# Patient Record
Sex: Male | Born: 1955 | Race: White | Hispanic: No | Marital: Married | Smoking: Current every day smoker
Health system: Southern US, Community
[De-identification: ages and names within clinical notes are randomized; demographics above are authoritative.]

## PROBLEM LIST (undated history)

## (undated) DIAGNOSIS — M1990 Unspecified osteoarthritis, unspecified site: Secondary | ICD-10-CM

## (undated) DIAGNOSIS — Z9989 Dependence on other enabling machines and devices: Secondary | ICD-10-CM

## (undated) DIAGNOSIS — G4730 Sleep apnea, unspecified: Secondary | ICD-10-CM

## (undated) HISTORY — DX: Sleep apnea, unspecified: G47.30

## (undated) HISTORY — PX: LITHOTRIPSY: SUR834

## (undated) HISTORY — PX: HERNIA REPAIR: SHX51

## (undated) HISTORY — PX: NO PAST SURGERIES: SHX2092

## (undated) HISTORY — PX: TONSILLECTOMY AND ADENOIDECTOMY: SHX28

---

## 2011-06-15 ENCOUNTER — Encounter: Payer: Self-pay | Admitting: *Deleted

## 2011-06-15 ENCOUNTER — Emergency Department
Admission: EM | Admit: 2011-06-15 | Discharge: 2011-06-15 | Disposition: A | Discharge status: Home / Self Care | Payer: 59 | Source: Home / Self Care | Attending: Family Medicine | Admitting: Family Medicine

## 2011-06-15 ENCOUNTER — Emergency Department
Admit: 2011-06-15 | Discharge: 2011-06-15 | Disposition: A | Discharge status: Home / Self Care | Payer: 59 | Attending: Family Medicine | Admitting: Family Medicine

## 2011-06-15 DIAGNOSIS — B354 Tinea corporis: Secondary | ICD-10-CM

## 2011-06-15 DIAGNOSIS — IMO0002 Reserved for concepts with insufficient information to code with codable children: Secondary | ICD-10-CM

## 2011-06-15 DIAGNOSIS — S8391XA Sprain of unspecified site of right knee, initial encounter: Secondary | ICD-10-CM

## 2011-06-15 MED ORDER — KETOCONAZOLE 2 % EX CREA: TOPICAL_CREAM | Freq: Two times a day (BID) | CUTANEOUS | Status: AC

## 2011-06-15 MED ORDER — NAPROXEN 500 MG PO TABS: 500.0000 mg | ORAL_TABLET | Freq: Two times a day (BID) | ORAL | Status: AC

## 2011-06-15 NOTE — Discharge Instructions (Signed)
Apply ice pack for 30 to 45 minutes every 1 to 4 hours.  Continue until swelling decreases.  May use crutches at home for 3 to 5 days.  Wear knee brace for about two weeks.  Begin knee exercises as per instruction sheet.  Followup with dermatologist if rash not cleared in two weeks.   Fungus Infection of the Skin An infection of your skin caused by a fungus is a very common problem. Treatment depends on which part of the body is affected. Types of fungal skin infection include:  Athlete's Foot(Tinea pedis). This infection starts between the toes and may involve the entire sole and sides of foot. It is the most common fungal disease. It is made worse by heat, moisture, and friction. To treat, wash your feet 2 to 3 times daily. Dry thoroughly between the toes. Use medicated foot powder or cream as directed on the package. Plain talc, cornstarch, or rice powder may be dusted into socks and shoes to keep the feet dry. Wearing footwear that allows ventilation is also helpful.   Ringworm (Tinea corporis and tinea capitis). This infection causes scaly red rings to form on the skin or scalp. For skin sores, apply medicated lotion or cream as directed on the package. For the scalp, medicated shampoo may be used with with other therapies. Ringworm of the scalp or fingernails usually requires using oral medicine for 2 to 4 months.   Tinea versicolor. This infection appears as painless, scaly, patchy areas of discolored skin (whitish to light brown). It is more common in the summer and favors oily areas of the skin such as those found at the chest, abdomen, back, pubis, neck, and body folds. It can be treated with medicated shampoo or with medicated topical cream. Oral antifungals may be needed for more active infections. The light and/or dark spots may take time to get better and is not a sign of treatment failure.  Fungal infections may need to be treated for several weeks to be cured. It is important not to  treat fungal infections with steroids or combination medicine that contains an antifungal and steroid as these will make the fungal infection worse. SEEK MEDICAL CARE IF:   You have persistent itching or rawness.   You have an oral temperature above 102 F (38.9 C).  Document Released: 05/13/2004 Document Revised: 12/16/2010 Document Reviewed: 07/29/2009 [facility] Patient Information 2012 [place], [place].

## 2011-06-15 NOTE — ED Notes (Signed)
Patient c/o right lower leg pain x 1 week. Unknown cause of pain. 3 days ago right knee popped, also c/o pain in right calf. Used knee brace and ibuprofen. Patient also c/o rash on his right thigh x 3 days. Used otc antibiotic ointment.

## 2011-06-15 NOTE — ED Provider Notes (Addendum)
History     CSN: [id number]  Arrival date & time 06/15/11  0827   First MD Initiated Contact with Patient 06/15/11 [phone]      Chief Complaint  Patient presents with  . Leg Pain     HPI Comments: Patient presents with two complaints: 1) About two weeks ago he developed vague pain in the posterior aspect of his right knee and upper calf.  Three days ago he felt a popping sensation in his right posterior knee and now has mild pain in his knee when climbing stairs and arising from chair, etc.  Denies swelling in right calf.  He recalls no preceding injury.  He denies locking or giving way of the right knee.  He states that he had a similar brief episode that resolved spontaneously.  2)  He complains of a 1 week history of a gradually expanding rash on his left anterior thigh that has not responded to Neosporin ointment.  The rash does not itch.  Patient is a 56 y.o. male presenting with leg pain. The history is provided by the patient.  Leg Pain  Incident onset: 2 weeks ago. The incident occurred at home. There was no injury mechanism. The pain is present in the right knee. The quality of the pain is described as aching. The pain is at a severity of 3/10. The pain has been fluctuating since onset. Pertinent negatives include no numbness, no inability to bear weight, no loss of motion, no muscle weakness, no loss of sensation and no tingling. The symptoms are aggravated by bearing weight. He has tried NSAIDs for the symptoms. The treatment provided mild relief.    History reviewed. No pertinent past medical history.  Past Surgical History  Procedure Date  . Hernia repair   . Lithotripsy     Family History  Problem Relation Age of Onset  . Cancer Mother     breast    History  Substance Use Topics  . Smoking status: Current Everyday Smoker -- 1.0 packs/day for 35 years  . Smokeless tobacco: Not on file  . Alcohol Use: No      Review of Systems  Constitutional: Negative.     HENT: Negative.   Eyes: Negative.   Respiratory: Negative.   Cardiovascular: Negative.   Gastrointestinal: Negative.   Genitourinary: Negative.   Musculoskeletal:       Pain right knee  Skin: Positive for rash.  Neurological: Negative.  Negative for tingling and numbness.  Hematological: Negative.     Allergies  Review of patient's allergies indicates no known allergies.  Home Medications   Current Outpatient Rx  Name Route Sig Dispense Refill  . KETOCONAZOLE 2 % EX CREA Topical Apply topically 2 (two) times daily. 60 g 0  . NAPROXEN 500 MG PO TABS Oral Take 1 tablet (500 mg total) by mouth 2 (two) times daily. (every 12 hours with food) 30 tablet 0    BP 143/80  Pulse 90  Temp(Src) 98.4 F (36.9 C) (Oral)  Resp 16  Ht 5' 6.5" (1.689 m)  Wt 229 lb (103.874 kg)  BMI 36.41 kg/m2  SpO2 98%  Physical Exam  Nursing note and vitals reviewed. Constitutional: He appears well-developed and well-nourished. No distress.  Cardiovascular: Normal rate, regular rhythm and normal heart sounds.   Pulmonary/Chest: Effort normal and breath sounds normal. No respiratory distress.  Musculoskeletal:       Right knee: He exhibits decreased range of motion. He exhibits no swelling, no effusion, no ecchymosis,  no deformity, no laceration, no erythema, normal alignment, no LCL laxity, normal patellar mobility and no bony tenderness. tenderness found. Medial joint line tenderness noted. No MCL, no LCL and no patellar tendon tenderness noted.       Legs:      There seems to be some very mild tenderness over extensor tendon insertions right popliteal fossa.  Apley grind test positive right knee.  There is no right calf tenderness or swelling.  Homan's test negative.  Distal right Neurovascular function is intact.   Skin:          Left anterior thigh reveals a 12cm dia macular erythematous nontender area with central clearing and well circumscribed margin.     ED Course  Procedures  none  Labs Reviewed - No data to display Dg Knee Complete 4 Views Right  06/15/2011  *RADIOLOGY REPORT*  Clinical Data: Right knee pain.  RIGHT KNEE - COMPLETE 4+ VIEW  Comparison: None  Findings: There are mild tricompartmental degenerative changes but no acute bony findings or osteochondral abnormalities.  A small joint effusion is noted.  IMPRESSION:  1.  Mild degenerative changes. 2.  No acute bony findings. 3.  Small joint effusion.  Original Report Authenticated By: P. Loralie Champagne, M.D.     1. Tinea corporis   2. Right knee sprain       MDM  Suspect meniscus injury right knee.  Hinged knee brace applied. Begin Naproxen BID .Apply ice pack for 30 to 45 minutes every 1 to 4 hours.  Continue until swelling decreases.  May use crutches at home for 3 to 5 days.  Wear knee brace for about two weeks.  Begin knee exercises as per instruction sheet ([facility] information and instruction handout given)  Followup with [facility] Medicine Clinic if not improving about two weeks.   Begin Nizoral cream to left anterior leg BID Followup with dermatologist if rash not cleared in two weeks.            Donna Christen, MD 06/16/11 [id number]  Donna Christen, MD 06/16/11 1750

## 2019-02-21 ENCOUNTER — Ambulatory Visit (INDEPENDENT_AMBULATORY_CARE_PROVIDER_SITE_OTHER): Payer: Managed Care, Other (non HMO) | Admitting: Sports Medicine

## 2019-02-21 ENCOUNTER — Encounter: Payer: Self-pay | Admitting: Sports Medicine

## 2019-02-21 ENCOUNTER — Other Ambulatory Visit: Payer: Self-pay

## 2019-02-21 ENCOUNTER — Ambulatory Visit (INDEPENDENT_AMBULATORY_CARE_PROVIDER_SITE_OTHER): Payer: Managed Care, Other (non HMO)

## 2019-02-21 DIAGNOSIS — M79651 Pain in right thigh: Secondary | ICD-10-CM

## 2019-02-21 MED ORDER — CELECOXIB 200 MG PO CAPS: ORAL_CAPSULE | ORAL | 2 refills | Status: DC

## 2019-02-21 NOTE — Assessment & Plan Note (Signed)
This occurred after [place]. Clinically this looks like a rectus femoris strain, it is improving little by little. Adding Celebrex, x-rays of the femur, he will do formal physical therapy for a month and then return to see me, we can do an MRI if no better.

## 2019-02-21 NOTE — Progress Notes (Signed)
Subjective:    CC: Right thigh injury  HPI:  2 months ago this pleasant 63 year old male was walking [place] dog at the beach, when coming down the stairs he took a misstep and fell, he remembers impacting his right lateral thigh.  He was able to bear weight after the fall.  Since then he has had pain localized anteriorly over the mid thigh, worse with lifting his leg.  Weightbearing.  Mild, improving, no radiation.  I reviewed the past medical history, family history, social history, surgical history, and allergies today and no changes were needed.  Please see the problem list section below in epic for further details.  Past Medical History: No past medical history on file. Past Surgical History: Past Surgical History:  Procedure Laterality Date  . HERNIA REPAIR    . LITHOTRIPSY     Social History: Social History   Socioeconomic History  . Marital status: Married    Spouse name: Not on file  . Number of children: Not on file  . Years of education: Not on file  . Highest education level: Not on file  Occupational History  . Not on file  Social Needs  . Financial resource strain: Not on file  . Food insecurity    Worry: Not on file    Inability: Not on file  . Transportation needs    Medical: Not on file    Non-medical: Not on file  Tobacco Use  . Smoking status: Current Every Day Smoker    Packs/day: 1.00    Years: 35.00    Pack years: 35.00  . Smokeless tobacco: Never Used  Substance and Sexual Activity  . Alcohol use: No  . Drug use: No  . Sexual activity: Not on file  Lifestyle  . Physical activity    Days per week: Not on file    Minutes per session: Not on file  . Stress: Not on file  Relationships  . Social Herbalist on phone: Not on file    Gets together: Not on file    Attends religious service: Not on file    Active member of club or organization: Not on file    Attends meetings of clubs or organizations: Not on file    Relationship status:  Not on file  Other Topics Concern  . Not on file  Social History Narrative  . Not on file   Family History: Family History  Problem Relation Age of Onset  . Cancer Mother        breast   Allergies: No Known Allergies Medications: See med rec.  Review of Systems: No headache, visual changes, nausea, vomiting, diarrhea, constipation, dizziness, abdominal pain, skin rash, fevers, chills, night sweats, swollen lymph nodes, weight loss, chest pain, body aches, joint swelling, muscle aches, shortness of breath, mood changes, visual or auditory hallucinations.  Objective:    General: Well Developed, well nourished, and in no acute distress.  Neuro: Alert and oriented x3, extra-ocular muscles intact, sensation grossly intact.  HEENT: Normocephalic, atraumatic, pupils equal round reactive to light, neck supple, no masses, no lymphadenopathy, thyroid nonpalpable.  Skin: Warm and dry, no rashes noted.  Cardiac: Regular rate and rhythm, no murmurs rubs or gallops.  Respiratory: Clear to auscultation bilaterally. Not using accessory muscles, speaking in full sentences.  Abdominal: Soft, nontender, nondistended, positive bowel sounds, no masses, no organomegaly.  Right hip: ROM IR: 60 Deg, ER: 60 Deg, Flexion: 120 Deg, Extension: 100 Deg, Abduction: 45 Deg, Adduction:  45 Deg Strength IR: 5/5, ER: 5/5, Flexion: 5/5, Extension: 5/5, Abduction: 5/5, Adduction: 5/5 Pelvic alignment unremarkable to inspection and palpation. Standing hip rotation and gait without trendelenburg / unsteadiness. Greater trochanter without tenderness to palpation. No tenderness over piriformis. No SI joint tenderness and normal minimal SI movement. Reproduction of pain with resisted flexion of the thigh with the knee extended.   No reproduction of pain with isolated resisted knee extension.  Impression and Recommendations:    The patient was counselled, risk factors were discussed, anticipatory guidance given.   Right thigh pain This occurred after a fall about 2 months ago at the beach. Clinically this looks like a rectus femoris strain, it is improving little by little. Adding Celebrex, x-rays of the femur, he will do formal physical therapy for a month and then return to see me, we can do an MRI if no better.   ___________________________________________ Ihor Austin. Benjamin Stain, M.D., ABFM., CAQSM. Primary Care and Sports Medicine [facility]  Adjunct Professor of Family Medicine  [employer]

## 2019-02-22 ENCOUNTER — Telehealth: Payer: Self-pay | Admitting: Sports Medicine

## 2019-02-22 NOTE — Telephone Encounter (Signed)
Received fax for PA on Celecoxib 200 mg sent through cover my meds and received authorization.   Case ID: [id number] Valid: 01/23/2019 - 02/22/2020  I will notify pharmacy. - CF

## 2019-03-21 ENCOUNTER — Ambulatory Visit: Payer: Managed Care, Other (non HMO) | Admitting: Sports Medicine

## 2019-08-28 ENCOUNTER — Emergency Department (INDEPENDENT_AMBULATORY_CARE_PROVIDER_SITE_OTHER)
Admission: EM | Admit: 2019-08-28 | Discharge: 2019-08-28 | Disposition: A | Discharge status: Home / Self Care | Payer: Managed Care, Other (non HMO) | Source: Home / Self Care

## 2019-08-28 ENCOUNTER — Other Ambulatory Visit: Payer: Self-pay

## 2019-08-28 ENCOUNTER — Encounter: Payer: Self-pay | Admitting: Emergency Medicine

## 2019-08-28 DIAGNOSIS — W57XXXA Bitten or stung by nonvenomous insect and other nonvenomous arthropods, initial encounter: Secondary | ICD-10-CM

## 2019-08-28 DIAGNOSIS — T07XXXA Unspecified multiple injuries, initial encounter: Secondary | ICD-10-CM

## 2019-08-28 DIAGNOSIS — L089 Local infection of the skin and subcutaneous tissue, unspecified: Secondary | ICD-10-CM | POA: Diagnosis not present

## 2019-08-28 HISTORY — DX: Dependence on other enabling machines and devices: Z99.89

## 2019-08-28 MED ORDER — DOXYCYCLINE HYCLATE 100 MG PO CAPS: 100.0000 mg | ORAL_CAPSULE | Freq: Two times a day (BID) | ORAL | 0 refills | Status: DC

## 2019-08-28 NOTE — ED Provider Notes (Signed)
[facility]    CSN: [id number] Arrival date & time: 08/28/19  1700      History   Chief Complaint Chief Complaint  Patient presents with  . Rash  . Tick Removal    bite 4-5 days    HPI Thomas Soto is a 63 y.o. male.   HPI  Thomas Soto is a 64 y.o. male presenting to UC with c/o red itchy slightly painful red spots on the Left side of his chest near his underarm. He reports removing a tick from the same area about 4-5 days ago. The tick he removed was crawling, he did not find any ticks embedded on his skin.  Denies fever, chills, n/v/d. No body aches or joint swelling. No medications tried PTA.   Past Medical History:  Diagnosis Date  . CPAP (continuous positive airway pressure) dependence   . Sleep apnea     Patient Active Problem List   Diagnosis Date Noted  . Right thigh pain 02/21/2019    Past Surgical History:  Procedure Laterality Date  . HERNIA REPAIR    . LITHOTRIPSY    . NO PAST SURGERIES         Home Medications    Prior to Admission medications   Medication Sig Start Date End Date Taking? Authorizing Provider  fluticasone (FLONASE) 50 MCG/ACT nasal spray 2 sprays by Both Nostrils route as needed.   Yes [provider]  loratadine (CLARITIN) 10 MG tablet Take by mouth.   Yes [provider]  celecoxib (CELEBREX) 200 MG capsule One to 2 tablets by mouth daily as needed for pain. 02/21/19   Silverio Decamp, MD  doxycycline (VIBRAMYCIN) 100 MG capsule Take 1 capsule (100 mg total) by mouth 2 (two) times daily. One po bid x 7 days 08/28/19   Noe Gens, PA-C    Family History Family History  Problem Relation Age of Onset  . Cancer Mother        breast  . Healthy Sister   . Healthy Brother   . Healthy Brother     Social History Social History   Tobacco Use  . Smoking status: Current Every Day Smoker    Packs/day: 1.00    Years: 35.00    Pack years: 35.00  . Smokeless tobacco: Never Used  Substance  Use Topics  . Alcohol use: No  . Drug use: No     Allergies   Patient has no known allergies.   Review of Systems Review of Systems  Constitutional: Negative for chills and fever.  Musculoskeletal: Negative for arthralgias and myalgias.  Skin: Positive for rash and wound.     Physical Exam Triage Vital Signs ED Triage Vitals  Enc Vitals Group     BP 08/28/19 1717 (!) 163/72     Pulse Rate 08/28/19 1717 87     Resp 08/28/19 1717 17     Temp 08/28/19 1717 98.3 F (36.8 C)     Temp Source 08/28/19 1717 Oral     SpO2 08/28/19 1717 96 %     Weight 08/28/19 1719 229 lb (103.9 kg)     Height --      Head Circumference --      Peak Flow --      Pain Score 08/28/19 1718 0     Pain Loc --      Pain Edu? --      Excl. in [place]? --    No data found.  Updated Vital  Signs BP (!) 163/72 (BP Location: Right Arm)   Pulse 87   Temp 98.3 F (36.8 C) (Oral)   Resp 17   Wt 229 lb (103.9 kg)   SpO2 96%   BMI 36.41 kg/m   Visual Acuity Right Eye Distance:   Left Eye Distance:   Bilateral Distance:    Right Eye Near:   Left Eye Near:    Bilateral Near:     Physical Exam Vitals and nursing note reviewed.  Constitutional:      Appearance: Normal appearance. He is well-developed.  HENT:     Head: Normocephalic and atraumatic.  Cardiovascular:     Rate and Rhythm: Normal rate.  Pulmonary:     Effort: Pulmonary effort is normal.  Musculoskeletal:        General: Normal range of motion.     Cervical back: Normal range of motion.  Skin:    General: Skin is warm and dry.     Findings: Erythema present.       Neurological:     Mental Status: He is alert and oriented to person, place, and time.  Psychiatric:        Behavior: Behavior normal.      UC Treatments / Results  Labs (all labs ordered are listed, but only abnormal results are displayed) Labs Reviewed - No data to display  EKG   Radiology No results found.  Procedures Procedures (including critical  care time)  Medications Ordered in UC Medications - No data to display  Initial Impression / Assessment and Plan / UC Course  I have reviewed the triage vital signs and the nursing notes.  Pertinent labs & imaging results that were available during my care of the patient were reviewed by me and considered in my medical decision making (see chart for details).     Will tx for infected insect bite, likely tick bite given recent removal of tick, possibly dislodged shortly after bite. Will start him on doxycycline F/u with PCP as needed   Final Clinical Impressions(s) / UC Diagnoses   Final diagnoses:  Infected insect bites of multiple sites  Tick bite, initial encounter     Discharge Instructions      Please take antibiotics as prescribed and be sure to complete entire course even if you start to feel better to ensure infection does not come back.  Follow up with family medicine as needed.    ED Prescriptions    Medication Sig Dispense Auth. Provider   doxycycline (VIBRAMYCIN) 100 MG capsule Take 1 capsule (100 mg total) by mouth 2 (two) times daily. One po bid x 7 days 14 capsule Lurene Shadow, [place]     PDMP not reviewed this encounter.   Lurene Shadow, [place] 08/28/19 1949

## 2019-08-28 NOTE — ED Triage Notes (Signed)
Tick bite noted under Left arm 4-5 days ago Presents today w/ two red round spots - to left side of chest Pt concerned about rash - denies pain, drainage or itching

## 2019-08-28 NOTE — Discharge Instructions (Signed)
  Please take antibiotics as prescribed and be sure to complete entire course even if you start to feel better to ensure infection does not come back.  Follow up with family medicine as needed. 

## 2019-10-09 ENCOUNTER — Encounter: Payer: Self-pay | Admitting: Emergency Medicine

## 2019-10-09 ENCOUNTER — Emergency Department (INDEPENDENT_AMBULATORY_CARE_PROVIDER_SITE_OTHER)
Admission: EM | Admit: 2019-10-09 | Discharge: 2019-10-09 | Disposition: A | Discharge status: Home / Self Care | Payer: Managed Care, Other (non HMO) | Source: Home / Self Care | Attending: Family Medicine | Admitting: Family Medicine

## 2019-10-09 ENCOUNTER — Other Ambulatory Visit: Payer: Self-pay

## 2019-10-09 DIAGNOSIS — J029 Acute pharyngitis, unspecified: Secondary | ICD-10-CM

## 2019-10-09 HISTORY — DX: Unspecified osteoarthritis, unspecified site: M19.90

## 2019-10-09 LAB — POCT RAPID STREP A (OFFICE): Rapid Strep A Screen: NEGATIVE

## 2019-10-09 MED ORDER — PENICILLIN V POTASSIUM 500 MG PO TABS: ORAL_TABLET | ORAL | 0 refills | Status: DC

## 2019-10-09 NOTE — ED Triage Notes (Signed)
Reports sore throat and fever between 110-101 past 2 days; does have seasonal allergies. Has had covid vaccinations.

## 2019-10-09 NOTE — Discharge Instructions (Addendum)
Try warm salt water gargles for sore throat.  ?May take Ibuprofen 200mg, 4 tabs every 8 hours with food.  ?

## 2019-10-09 NOTE — ED Provider Notes (Signed)
[facility]    CSN: [id number] Arrival date & time: 10/09/19  1718      History   Chief Complaint Chief Complaint  Patient presents with   Sore Throat   Fever    HPI Thomas Soto is a 64 y.o. male.   Patient developed a sore throat radiating to his left ear three days ago.  He has had chills and fever as high as 100.4.  He denies nasal congestion, cough, etc.  The history is provided by the patient.    Past Medical History:  Diagnosis Date   Arthritis    CPAP (continuous positive airway pressure) dependence    Sleep apnea     Patient Active Problem List   Diagnosis Date Noted   Right thigh pain 02/21/2019    Past Surgical History:  Procedure Laterality Date   HERNIA REPAIR     LITHOTRIPSY     NO PAST SURGERIES         Home Medications    Prior to Admission medications   Medication Sig Start Date End Date Taking? Authorizing Provider  celecoxib (CELEBREX) 200 MG capsule One to 2 tablets by mouth daily as needed for pain. 02/21/19   Monica Becton, MD  doxycycline (VIBRAMYCIN) 100 MG capsule Take 1 capsule (100 mg total) by mouth 2 (two) times daily. One po bid x 7 days 08/28/19   Lurene Shadow, PA-C  fluticasone [facility]) 50 MCG/ACT nasal spray 2 sprays by Both Nostrils route as needed.    [provider]  loratadine (CLARITIN) 10 MG tablet Take by mouth.    [provider]  penicillin v potassium (VEETID) 500 MG tablet Take one tab by mouth Q12 hours for 10 days 10/09/19   Lattie Haw, MD    Family History Family History  Problem Relation Age of Onset   Cancer Mother        breast   Healthy Sister    Healthy Brother    Healthy Brother     Social History Social History   Tobacco Use   Smoking status: Current Every Day Smoker    Packs/day: 1.00    Years: 35.00    Pack years: 35.00   Smokeless tobacco: Never Used  Building services engineer Use: Never used  Substance Use Topics   Alcohol  use: No   Drug use: No     Allergies   Patient has no known allergies.   Review of Systems Review of Systems + sore throat No cough No pleuritic pain No wheezing No nasal congestion No post-nasal drainage No sinus pain/pressure No itchy/red eyes ? earache No hemoptysis No SOB + fever/chills No nausea No vomiting No abdominal pain No diarrhea No urinary symptoms No skin rash + fatigue No myalgias No headache   Physical Exam Triage Vital Signs ED Triage Vitals  Enc Vitals Group     BP 10/09/19 1731 (!) 155/79     Pulse Rate 10/09/19 1731 (!) 104     Resp 10/09/19 1731 18     Temp 10/09/19 1731 98.7 F (37.1 C)     Temp Source 10/09/19 1731 Oral     SpO2 10/09/19 1731 (!) 5 %     Weight 10/09/19 1735 230 lb (104.3 kg)     Height 10/09/19 1735 5\' 7"  (1.702 m)     Head Circumference --      Peak Flow --      Pain Score 10/09/19 1734 5  Pain Loc --      Pain Edu? --      Excl. in [place]? --    No data found.  Updated Vital Signs BP (!) 155/79 (BP Location: Right Arm)    Pulse (!) 104    Temp 98.7 F (37.1 C) (Oral)    Resp 18    Ht 5\' 7"  (1.702 m)    Wt 104.3 kg    SpO2 (!) 5%    BMI 36.02 kg/m   Visual Acuity Right Eye Distance:   Left Eye Distance:   Bilateral Distance:    Right Eye Near:   Left Eye Near:    Bilateral Near:     Physical Exam Nursing notes and Vital Signs reviewed. Appearance:  Patient appears stated age, and in no acute distress Eyes:  Pupils are equal, round, and reactive to light and accomodation.  Extraocular movement is intact.  Conjunctivae are not inflamed  Ears:  Canals normal.  Tympanic membranes normal.  Nose:   Normal turbinates.  No sinus tenderness.  Mouth:  moist mucous membranes. Pharynx:  Erythematous Neck:  Supple.  Tender enlarged tonsillar nodes. Lungs:  Clear to auscultation.  Breath sounds are equal.  Moving air well. Heart:  Regular rate and rhythm without murmurs, rubs, or gallops.  Abdomen:  Nontender  without masses or hepatosplenomegaly.  Bowel sounds are present.  No CVA or flank tenderness.  Extremities:  No edema.  Skin:  No rash present.   UC Treatments / Results  Labs (all labs ordered are listed, but only abnormal results are displayed) Labs Reviewed  STREP A DNA PROBE  POCT RAPID STREP A (OFFICE) negative    EKG   Radiology No results found.  Procedures Procedures (including critical care time)  Medications Ordered in UC Medications - No data to display  Initial Impression / Assessment and Plan / UC Course  I have reviewed the triage vital signs and the nursing notes.  Pertinent labs & imaging results that were available during my care of the patient were reviewed by me and considered in my medical decision making (see chart for details).    CENTOR 4.  ?false negative rapid strep test.  Begin PenVK Followup with Family Doctor if not improved in 10 days. Final Clinical Impressions(s) / UC Diagnoses   Final diagnoses:  Pharyngitis, unspecified etiology     Discharge Instructions     Try warm salt water gargles for sore throat.  May take Ibuprofen 200mg , 4 tabs every 8 hours with food.     ED Prescriptions    Medication Sig Dispense Auth. Provider   penicillin v potassium (VEETID) 500 MG tablet Take one tab by mouth Q12 hours for 10 days 20 tablet Kandra Nicolas, MD        Kandra Nicolas, MD 10/13/19 [phone]

## 2019-10-10 LAB — STREP A DNA PROBE: Group A Strep Probe: NOT DETECTED

## 2019-10-12 ENCOUNTER — Telehealth: Payer: Self-pay

## 2019-10-12 NOTE — Telephone Encounter (Signed)
Pt not better with penicillin. Strep cx neg. Advised to increase fluids and take OTC meds to help with sxs. If not better by Monday, follow up with PCP.

## 2021-01-05 ENCOUNTER — Other Ambulatory Visit: Payer: Self-pay

## 2021-01-05 ENCOUNTER — Ambulatory Visit: Payer: Managed Care, Other (non HMO) | Admitting: Sports Medicine

## 2021-01-05 ENCOUNTER — Ambulatory Visit (INDEPENDENT_AMBULATORY_CARE_PROVIDER_SITE_OTHER): Payer: Managed Care, Other (non HMO)

## 2021-01-05 DIAGNOSIS — M51369 Other intervertebral disc degeneration, lumbar region without mention of lumbar back pain or lower extremity pain: Secondary | ICD-10-CM | POA: Insufficient documentation

## 2021-01-05 DIAGNOSIS — M549 Dorsalgia, unspecified: Secondary | ICD-10-CM

## 2021-01-05 DIAGNOSIS — R1909 Other intra-abdominal and pelvic swelling, mass and lump: Secondary | ICD-10-CM | POA: Diagnosis not present

## 2021-01-05 DIAGNOSIS — M5136 Other intervertebral disc degeneration, lumbar region: Secondary | ICD-10-CM

## 2021-01-05 MED ORDER — PREDNISONE 50 MG PO TABS: ORAL_TABLET | ORAL | 0 refills | Status: DC

## 2021-01-05 MED ORDER — MELOXICAM 15 MG PO TABS: ORAL_TABLET | ORAL | 3 refills | Status: AC

## 2021-01-05 NOTE — Progress Notes (Signed)
    Procedures performed today:    None.  Independent interpretation of notes and tests performed by another provider:   None.  Brief History, Exam, Impression, and Recommendations:    Groin mass Tin has noted a several month history of lumps in his right groin, he does not have a primary care provider right now per his report but does plan to establish with Dr. Everrett Coombe here, I would like to go ahead and get the work-up started, I do feel some fullness and potentially some inguinal lymphadenopathy, we will go ahead and get an ultrasound. No constitutional symptoms. Further management per primary care provider.  Lumbar degenerative disc disease Severe axial low back pain over the past couple weeks, worse with sitting, flexion, Valsalva, no red flag symptoms, no radicular pain. No progressive weakness, adding x-rays, prednisone followed by meloxicam, formal physical therapy, return to see me in 6 weeks.    ___________________________________________ Ihor Austin. Benjamin Stain, M.D., ABFM., CAQSM. Primary Care and Sports Medicine [facility]  Adjunct Instructor of Family Medicine  [employer]

## 2021-01-05 NOTE — Assessment & Plan Note (Signed)
Severe axial low back pain over the past couple weeks, worse with sitting, flexion, Valsalva, no red flag symptoms, no radicular pain. No progressive weakness, adding x-rays, prednisone followed by meloxicam, formal physical therapy, return to see me in 6 weeks.

## 2021-01-05 NOTE — Assessment & Plan Note (Signed)
Thomas Soto has noted a several month history of lumps in his right groin, he does not have a primary care provider right now per his report but does plan to establish with Dr. Everrett Coombe here, I would like to go ahead and get the work-up started, I do feel some fullness and potentially some inguinal lymphadenopathy, we will go ahead and get an ultrasound. No constitutional symptoms. Further management per primary care provider.

## 2021-01-08 ENCOUNTER — Ambulatory Visit: Payer: Managed Care, Other (non HMO) | Admitting: Rehabilitative and Restorative Service Providers"

## 2021-01-08 ENCOUNTER — Other Ambulatory Visit: Payer: Self-pay

## 2021-01-08 DIAGNOSIS — R29898 Other symptoms and signs involving the musculoskeletal system: Secondary | ICD-10-CM

## 2021-01-08 DIAGNOSIS — M545 Low back pain, unspecified: Secondary | ICD-10-CM

## 2021-01-08 NOTE — Patient Instructions (Signed)
Access Code: [facility] URL: [email or web address] Date: 01/08/2021 Prepared by: Margretta Ditty  Exercises Seated Hamstring Stretch - 1-2 x daily - 7 x weekly - 1 sets - 2 reps - 30 seconds hold Gastroc Stretch on Wall - 1-2 x daily - 7 x weekly - 1 sets - 2 reps - 30 seconds hold Wall Quarter Squat - 1-2 x daily - 7 x weekly - 1 sets - 10-15 reps Single Leg Stance - 2 x daily - 7 x weekly - 1 sets - 3 reps - 15 seconds hold Supine Bridge - 1-2 x daily - 7 x weekly - 1 sets - 10 reps - 3-5 seconds hold

## 2021-01-08 NOTE — Therapy (Signed)
[facility] [place] [address] [place], [place], [address] Phone: [phone]   Fax:  [phone]  Physical Therapy Evaluation  Patient Details  Name: Thomas Soto MRN: [id number] Date of Birth: 01/11/1956 Referring Provider (PT): Rodney Langton, MD   Encounter Date: 01/08/2021   PT End of Session - 01/08/21 1815     Visit Number 1    Number of Visits 12    Date for PT Re-Evaluation 02/19/21    PT Start Time 1533    PT Stop Time 1614    PT Time Calculation (min) 41 min             Past Medical History:  Diagnosis Date   Arthritis    CPAP (continuous positive airway pressure) dependence    Sleep apnea     Past Surgical History:  Procedure Laterality Date   HERNIA REPAIR     LITHOTRIPSY     NO PAST SURGERIES      There were no vitals filed for this visit.    Subjective Assessment - 01/08/21 1537     Subjective The patient reports acute pain that began last week and was sharp in nature.  He still notes occasional pain later in the day and after sitting for extended periods.  It does not radiate into the hips/legs.    Pertinent History sleep apnea, arthritis    Patient Stated Goals get better, reduce pain    Currently in Pain? Yes    Pain Score 1     Pain Location Back    Pain Orientation Lower    Pain Descriptors / Indicators --   when acute pain hits, it's "sharp"   Pain Type Chronic pain;Acute pain    Pain Onset More than a month ago    Pain Frequency Intermittent    Aggravating Factors  had 2-3 days of sharp pain last week    Pain Relieving Factors meds reduced pain                [facility] PT Assessment - 01/08/21 1544       Assessment   Medical Diagnosis Lumbar DDD    Referring Provider (PT) Rodney Langton, MD    Onset Date/Surgical Date 12/29/20    Hand Dominance Right    Prior Therapy none      Precautions   Precautions None      Restrictions   Weight Bearing Restrictions No       Balance Screen   Has the patient fallen in the past 6 months No    Has the patient had a decrease in activity level because of a fear of falling?  No    Is the patient reluctant to leave their home because of a fear of falling?  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      Observation/Other Assessments   Focus on Therapeutic Outcomes (FOTO)  83%      Sensation   Light Touch Appears Intact      Posture/Postural Control   Posture/Postural Control Postural limitations    Postural Limitations Posterior pelvic tilt      ROM / Strength   AROM /  PROM / Strength AROM;Strength      AROM   Overall AROM Comments tightness noted in HS    AROM Assessment Site Lumbar    Lumbar Flexion 50% limited    Lumbar Extension 25% limited    Lumbar - Right Side Bend 50% limited    Lumbar - Left Side Bend 50% limited    Lumbar - Right Rotation 25% limited    Lumbar - Left Rotation 25% limited      Strength   Strength Assessment Site Hip;Knee;Ankle    Right/Left Hip Right;Left    Right Hip Flexion 5/5    Right Hip ADduction 5/5    Left Hip Flexion 5/5    Left Hip ABduction 5/5    Right/Left Knee Right;Left    Right Knee Flexion 5/5    Right Knee Extension 5/5    Left Knee Flexion 5/5    Left Knee Extension 5/5    Right/Left Ankle Right;Left    Right Ankle Dorsiflexion 5/5    Left Ankle Dorsiflexion 5/5      Flexibility   Soft Tissue Assessment /Muscle Length yes    Hamstrings bilateral tightness    Quadriceps bilateral tightness      Palpation   Spinal mobility hypomobile to CPA low thoracic and lumbar spine;    Palpation comment no pain with palpation to multifidi and lumbar paraspinals                         Objective measurements completed on examination: See above findings.       [facility] Adult PT Treatment/Exercise - 01/08/21 1556       Exercises   Exercises Lumbar;Knee/Hip      Lumbar Exercises: Stretches   Active Hamstring Stretch Right;Left;1 rep;30 seconds      Lumbar Exercises: Supine   Bridge 10 reps                     PT Education - 01/08/21 1604     Education Details HEP    Person(s) Educated Patient    Methods Explanation;Demonstration;Handout    Comprehension Verbalized understanding;Returned demonstration                 PT Long Term Goals - 01/08/21 1833       PT LONG TERM GOAL #1   Title The patient will be indep with HEP.    Time 6    Period Weeks    Target Date 02/19/21      PT LONG TERM GOAL #2   Title The patient will improve functional status score from 83% up to 88%    Time 6    Period Weeks    Target Date 02/19/21      PT LONG TERM GOAL #3   Title The patient will report 0/10 pain at rest noting resolution of symptoms.    Time 6    Period Weeks    Target Date 02/19/21      PT LONG TERM GOAL #4   Title The patient will return demo lifting 30 lb box to demonstrate ability to lift for work activities.    Time 6    Period Weeks    Target Date 02/19/21                    Plan - 01/08/21 1841     Clinical Impression Statement The patient is a 65 year old male presenting to OP  physical therapy with acute onset of low back pain last week.  He has significant improvement at this time with medical mgmt.  He presents with dec'd AROM lumbar spine, dec'd flexibility in LEs/hips, hypomobility thoracic and lumbar spines, and pain after sitting.  PT to address deficits to return to prior functional status.    Personal Factors and Comorbidities Comorbidity 1    Comorbidities arthitis    Examination-Activity Limitations Locomotion Level;Sit;Squat;Bend;Lift    Examination-Participation Restrictions  Occupation    Stability/Clinical Decision Making Stable/Uncomplicated    Clinical Decision Making Low    Rehab Potential Good    PT Frequency 2x / week   scheduled 1x/week at this time   PT Duration 6 weeks    PT Treatment/Interventions ADLs/Self Care Home Management;Patient/family education;Therapeutic activities;Therapeutic exercise;Gait training;Electrical Stimulation;Moist Heat;Dry needling;Manual techniques;Taping;Traction    PT Next Visit Plan *assess change in how he feels after steroid finished, modify and progress HEP, LE flexibility, strengthening, work on lifting for work    PT Home Exercise Plan [facility]    Consulted and Agree with Plan of Care Patient             Patient will benefit from skilled therapeutic intervention in order to improve the following deficits and impairments:  Pain, Hypomobility, Impaired flexibility, Postural dysfunction, Increased fascial restricitons  Visit Diagnosis: Acute midline low back pain without sciatica  Other symptoms and signs involving the musculoskeletal system     Problem List Patient Active Problem List   Diagnosis Date Noted   Lumbar degenerative disc disease 01/05/2021   Groin mass 01/05/2021    Rayel Santizo, PT 01/08/2021, 6:44 PM  [facility] [address] [place], [place], [address] Phone: [phone]   Fax:  [phone]  Name: Thomas Soto MRN: [id number] Date of Birth: Nov 26, 1955

## 2021-01-14 ENCOUNTER — Encounter: Payer: Self-pay | Admitting: Rehabilitative and Restorative Service Providers"

## 2021-01-14 ENCOUNTER — Other Ambulatory Visit: Payer: Self-pay

## 2021-01-14 ENCOUNTER — Ambulatory Visit (INDEPENDENT_AMBULATORY_CARE_PROVIDER_SITE_OTHER): Payer: Managed Care, Other (non HMO) | Admitting: Rehabilitative and Restorative Service Providers"

## 2021-01-14 DIAGNOSIS — R29898 Other symptoms and signs involving the musculoskeletal system: Secondary | ICD-10-CM | POA: Diagnosis not present

## 2021-01-14 DIAGNOSIS — M545 Low back pain, unspecified: Secondary | ICD-10-CM | POA: Diagnosis not present

## 2021-01-14 NOTE — Patient Instructions (Addendum)
Sleeping on Back  Place pillow under knees. A pillow with cervical support and a roll around waist are also helpful. Copyright  VHI. All rights reserved.  Sleeping on Side Place pillow between knees. Use cervical support under neck and a roll around waist as needed. Copyright  VHI. All rights reserved.   Sleeping on Stomach   If this is the only desirable sleeping position, place pillow under lower legs, and under stomach or chest as needed.  Posture - Sitting   Sit upright, head facing forward. Try using a roll to support lower back. Keep shoulders relaxed, and avoid rounded back. Keep hips level with knees. Avoid crossing legs for long periods. Stand to Sit / Sit to Stand   To sit: Bend knees to lower self onto front edge of chair, then scoot back on seat. To stand: Reverse sequence by placing one foot forward, and scoot to front of seat. Use rocking motion to stand up.   Work Height and Reach  Ideal work height is no more than 2 to 4 inches below elbow level when standing, and at elbow level when sitting. Reaching should be limited to arm's length, with elbows slightly bent.  Bending  Bend at hips and knees, not back. Keep feet shoulder-width apart.    Posture - Standing   Good posture is important. Avoid slouching and forward head thrust. Maintain curve in low back and align ears over shoul- ders, hips over ankles.  Alternating Positions   Alternate tasks and change positions frequently to reduce fatigue and muscle tension. Take rest breaks. Computer Work   Position work to Programmer, multimedia. Use proper work and seat height. Keep shoulders back and down, wrists straight, and elbows at right angles. Use chair that provides full back support. Add footrest and lumbar roll as needed.  Getting Into / Out of Car  Lower self onto seat, scoot back, then bring in one leg at a time. Reverse sequence to get out.  Dressing  Lie on back to pull socks or slacks over feet, or sit  and bend leg while keeping back straight.    Housework - Sink  Place one foot on ledge of cabinet under sink when standing at sink for prolonged periods.   Pushing / Pulling  Pushing is preferable to pulling. Keep back in proper alignment, and use leg muscles to do the work.  Deep Squat   Squat and lift with both arms held against upper trunk. Tighten stomach muscles without holding breath. Use smooth movements to avoid jerking.  Avoid Twisting   Avoid twisting or bending back. Pivot around using foot movements, and bend at knees if needed when reaching for articles.  Carrying Luggage   Distribute weight evenly on both sides. Use a cart whenever possible. Do not twist trunk. Move body as a unit.   Lifting Principles Maintain proper posture and head alignment. Slide object as close as possible before lifting. Move obstacles out of the way. Test before lifting; ask for help if too heavy. Tighten stomach muscles without holding breath. Use smooth movements; do not jerk. Use legs to do the work, and pivot with feet. Distribute the work load symmetrically and close to the center of trunk. Push instead of pull whenever possible.   Ask For Help   Ask for help and delegate to others when possible. Coordinate your movements when lifting together, and maintain the low back curve.  Log Roll   Lying on back, bend left knee and place left  arm across chest. Roll all in one movement to the right. Reverse to roll to the left. Always move as one unit. Housework - Sweeping  Use long-handled equipment to avoid stooping.   Housework - Wiping  Position yourself as close as possible to reach work surface. Avoid straining your back.  Laundry - Unloading Wash   To unload small items at bottom of washer, lift leg opposite to arm being used to reach.  Gardening - Raking  Move close to area to be raked. Use arm movements to do the work. Keep back straight and avoid twisting.      Cart  When reaching into cart with one arm, lift opposite leg to keep back straight.   Getting Into / Out of Bed  Lower self to lie down on one side by raising legs and lowering head at the same time. Use arms to assist moving without twisting. Bend both knees to roll onto back if desired. To sit up, start from lying on side, and use same move-ments in reverse. Housework - Vacuuming  Hold the vacuum with arm held at side. Step back and forth to move it, keeping head up. Avoid twisting.   Laundry - Armed forces training and education officer so that bending and twisting can be avoided.   Laundry - Unloading Dryer  Squat down to reach into clothes dryer or use a reacher.  Gardening - Weeding / Psychiatric nurse or Kneel. Knee pads may be helpful.                     Access Code: [facility] URL: [email or web address] Date: 01/14/2021 Prepared by: Corlis Leak  Exercises Seated Hamstring Stretch - 1-2 x daily - 7 x weekly - 1 sets - 2 reps - 30 seconds hold Gastroc Stretch on Wall - 1-2 x daily - 7 x weekly - 1 sets - 2 reps - 30 seconds hold Wall Quarter Squat - 1-2 x daily - 7 x weekly - 1 sets - 10-15 reps Single Leg Stance - 2 x daily - 7 x weekly - 1 sets - 3 reps - 15 seconds hold Supine Bridge - 1-2 x daily - 7 x weekly - 1 sets - 10 reps - 3-5 seconds hold Supine Piriformis Stretch with Leg Straight - 2 x daily - 7 x weekly - 1 sets - 3 reps - 30 sec hold Prone Press Up On Elbows - 2 x daily - 7 x weekly - 1 sets - 3 reps - 1-2 min sec hold Sit to Stand - 2 x daily - 7 x weekly - 1 sets - 10 reps - 3-5 sec hold Doorway Pec Stretch at 60 Degrees Abduction - 3 x daily - 7 x weekly - 3 reps - 1 sets Doorway Pec Stretch at 90 Degrees Abduction - 3 x daily - 7 x weekly - 3 reps - 1 sets - 30 seconds hold Doorway Pec Stretch at 120 Degrees Abduction - 3 x daily - 7 x weekly - 3 reps - 1 sets - 30 second hold hold

## 2021-01-14 NOTE — Therapy (Addendum)
[place], [place], [address] Phone: [phone]   Fax:  [phone]  Physical Therapy Treatment and Discharge Summary    PHYSICAL THERAPY DISCHARGE SUMMARY  Visits from Start of Care: 2  Current functional level related to goals / functional outcomes: See last progress note for discharge status    Remaining deficits: Unknown   Education / Equipment: HEP    Patient agrees to discharge. Patient goals were not met. Patient is being discharged due to the patient's request.   P. Helene Kelp PT, MPH 02/19/21 2:03 PM   Patient Details  Name: Thomas Soto MRN: [id number] Date of Birth: 1955/08/30 Referring Provider (PT): Aundria Mems, MD   Encounter Date: 01/14/2021   PT End of Session - 01/14/21 1505     Visit Number 2    Number of Visits 12    Date for PT Re-Evaluation 02/19/21    PT Start Time 1505    PT Stop Time 1553    PT Time Calculation (min) 48 min    Activity Tolerance Patient tolerated treatment well             Past Medical History:  Diagnosis Date   Arthritis    CPAP (continuous positive airway pressure) dependence    Sleep apnea     Past Surgical History:  Procedure Laterality Date   HERNIA REPAIR     LITHOTRIPSY     NO PAST SURGERIES      There were no vitals filed for this visit.   Subjective Assessment - 01/14/21 1509     Subjective Patient reports that he continues to be on pain medication until Friday and he is still doing better. Working on exercises at home. He continues to be at work but has not lifted as heavy. He is lifting maybe about 30 pounds. Typically may have to lift up to 75-80 pounds at times. He is lifting a motor that is about 2 ft x 1 ft x 2 ft every 2-3 weeks.    Currently in Pain? No/denies    Pain Score 0-No pain    Pain Location Back                               [facility] Adult PT Treatment/Exercise - 01/14/21  0001       Self-Care   Self-Care Other Self-Care Comments;Lifting    Lifting lifting from 8 inch stool 15# KB - patient with difficulty with technique unable to hinge hips demo bending fwd rounding lumbar spine - held and started with sit to stand    Other Self-Care Comments  discussed and praciced sitting with improved lumbar support and modifying the recliner      Therapeutic Activites    Therapeutic Activities Other Therapeutic Activities      Lumbar Exercises: Stretches   Active Hamstring Stretch Right;Left;2 reps;30 seconds    Prone on Elbows Stretch 2 reps;60 seconds    Press Ups 5 reps   2-3 sec hold   Press Ups Limitations limited/tight    Piriformis Stretch Right;Left;2 reps;30 seconds    Piriformis Stretch Limitations supine travell    Gastroc Stretch Right;Left;2 reps;30 seconds    Gastroc Stretch Limitations modified to prop forearms on counter to avoid irritation of shoulders    Other Lumbar Stretch Exercise doorway stretch 30 sec x 2 reps each position      Lumbar Exercises: Aerobic   N[place]  [place], [place], [address] Phone: [phone]   Fax:  [phone]  Physical Therapy Treatment and Discharge Summary    PHYSICAL THERAPY DISCHARGE SUMMARY  Visits from Start of Care: 2  Current functional level related to goals / functional outcomes: See last progress note for discharge status    Remaining deficits: Unknown   Education / Equipment: HEP    Patient agrees to discharge. Patient goals were not met. Patient is being discharged due to the patient's request.   P. Helene Kelp PT, MPH 02/19/21 2:03 PM   Patient Details  Name: Thomas Soto MRN: [id number] Date of Birth: 1955/08/30 Referring Provider (PT): Aundria Mems, MD   Encounter Date: 01/14/2021   PT End of Session - 01/14/21 1505     Visit Number 2    Number of Visits 12    Date for PT Re-Evaluation 02/19/21    PT Start Time 1505    PT Stop Time 1553    PT Time Calculation (min) 48 min    Activity Tolerance Patient tolerated treatment well             Past Medical History:  Diagnosis Date   Arthritis    CPAP (continuous positive airway pressure) dependence    Sleep apnea     Past Surgical History:  Procedure Laterality Date   HERNIA REPAIR     LITHOTRIPSY     NO PAST SURGERIES      There were no vitals filed for this visit.   Subjective Assessment - 01/14/21 1509     Subjective Patient reports that he continues to be on pain medication until Friday and he is still doing better. Working on exercises at home. He continues to be at work but has not lifted as heavy. He is lifting maybe about 30 pounds. Typically may have to lift up to 75-80 pounds at times. He is lifting a motor that is about 2 ft x 1 ft x 2 ft every 2-3 weeks.    Currently in Pain? No/denies    Pain Score 0-No pain    Pain Location Back                               [facility] Adult PT Treatment/Exercise - 01/14/21  0001       Self-Care   Self-Care Other Self-Care Comments;Lifting    Lifting lifting from 8 inch stool 15# KB - patient with difficulty with technique unable to hinge hips demo bending fwd rounding lumbar spine - held and started with sit to stand    Other Self-Care Comments  discussed and praciced sitting with improved lumbar support and modifying the recliner      Therapeutic Activites    Therapeutic Activities Other Therapeutic Activities      Lumbar Exercises: Stretches   Active Hamstring Stretch Right;Left;2 reps;30 seconds    Prone on Elbows Stretch 2 reps;60 seconds    Press Ups 5 reps   2-3 sec hold   Press Ups Limitations limited/tight    Piriformis Stretch Right;Left;2 reps;30 seconds    Piriformis Stretch Limitations supine travell    Gastroc Stretch Right;Left;2 reps;30 seconds    Gastroc Stretch Limitations modified to prop forearms on counter to avoid irritation of shoulders    Other Lumbar Stretch Exercise doorway stretch 30 sec x 2 reps each position      Lumbar Exercises: Aerobic   Nustep

## 2021-01-22 ENCOUNTER — Encounter: Payer: Managed Care, Other (non HMO) | Admitting: Rehabilitative and Restorative Service Providers"

## 2021-02-04 ENCOUNTER — Ambulatory Visit (INDEPENDENT_AMBULATORY_CARE_PROVIDER_SITE_OTHER): Payer: Managed Care, Other (non HMO) | Admitting: Family Medicine

## 2021-02-04 ENCOUNTER — Other Ambulatory Visit: Payer: Self-pay

## 2021-02-04 ENCOUNTER — Encounter: Payer: Self-pay | Admitting: Family Medicine

## 2021-02-04 VITALS — BP 164/73 | HR 107 | Temp 98.3°F | Ht 66.5 in | Wt 226.8 lb

## 2021-02-04 DIAGNOSIS — R1909 Other intra-abdominal and pelvic swelling, mass and lump: Secondary | ICD-10-CM

## 2021-02-04 DIAGNOSIS — R1031 Right lower quadrant pain: Secondary | ICD-10-CM | POA: Diagnosis not present

## 2021-02-04 NOTE — Progress Notes (Signed)
Thomas Soto - 65 y.o. male MRN [id number]  Date of birth: [birth year]  Subjective Chief Complaint  Patient presents with   Hernia   Establish Care    HPI Thomas Soto is a 65 year old male here today for initial visit to establish care.  He has history of OSA and degenerative disc disease.  His concern today is a lump in his groin area.  He did discuss this with Dr. Benjamin Stain at previous visit and an ultrasound was ordered.  He has not been scheduled or had this completed yet.  He has noticed area of fullness in the groin.  He denies significant pain.  He is not having testicular pain.  ROS:  A comprehensive ROS was completed and negative except as noted per HPI  No Known Allergies  Past Medical History:  Diagnosis Date   Arthritis    CPAP (continuous positive airway pressure) dependence    Sleep apnea     Past Surgical History:  Procedure Laterality Date   HERNIA REPAIR     LITHOTRIPSY     TONSILLECTOMY AND ADENOIDECTOMY N/A     Social History   Socioeconomic History   Marital status: Married    Spouse name: Not on file   Number of children: Not on file   Years of education: Not on file   Highest education level: Not on file  Occupational History   Not on file  Tobacco Use   Smoking status: Every Day    Packs/day: 1.00    Years: 45.00    Pack years: 45.00    Types: Cigarettes    Start date: 04/20/1975   Smokeless tobacco: Never  Vaping Use   Vaping Use: Never used  Substance and Sexual Activity   Alcohol use: No   Drug use: No   Sexual activity: Yes    Partners: Female  Other Topics Concern   Not on file  Social History Narrative   Not on file   Social Determinants of Health   Financial Resource Strain: Not on file  Food Insecurity: Not on file  Transportation Needs: Not on file  Physical Activity: Not on file  Stress: Not on file  Social Connections: Not on file    Family History  Problem Relation Age of Onset   Cancer Mother        breast    Alzheimer's disease Mother    Skin cancer Mother    Healthy Sister    Healthy Brother    Healthy Brother     Health Maintenance  Topic Date Due   COVID-19 Vaccine (1) Never done   Pneumococcal Vaccine 39-17 Years old (1 - PCV) Never done   HIV Screening  Never done   Hepatitis C Screening  Never done   TETANUS/TDAP  Never done   Zoster Vaccines- Shingrix (1 of 2) Never done   COLONOSCOPY (Pts 45-73yrs Insurance coverage will need to be confirmed)  Never done   INFLUENZA VACCINE  Completed   HPV VACCINES  Aged Out     ----------------------------------------------------------------------------------------------------------------------------------------------------------------------------------------------------------------- Physical Exam BP (!) 164/73 (BP Location: Left Arm, Patient Position: Sitting, Cuff Size: Large)   Pulse (!) 107   Temp 98.3 F (36.8 C)   Ht 5' 6.5" (1.689 m)   Wt 226 lb 12.8 oz (102.9 kg)   SpO2 95%   BMI 36.06 kg/m   Physical Exam Constitutional:      Appearance: Normal appearance.  HENT:     Head: Normocephalic and atraumatic.  Cardiovascular:  Rate and Rhythm: Normal rate and regular rhythm.  Pulmonary:     Effort: Pulmonary effort is normal.     Breath sounds: Normal breath sounds.  Abdominal:     General: Abdomen is flat.     Palpations: Abdomen is soft.     Comments: He does have some palpable right inguinal fullness.  There may be a few enlarged lymph nodes as well.  Musculoskeletal:     Cervical back: Neck supple.  Neurological:     Mental Status: He is alert.  Psychiatric:        Mood and Affect: Mood normal.        Behavior: Behavior normal.    ------------------------------------------------------------------------------------------------------------------------------------------------------------------------------------------------------------------- Assessment and Plan  Groin mass Ultrasound ordered for further  evaluation.  If this is inconclusive we may need to obtain CT scan.   No orders of the defined types were placed in this encounter.   No follow-ups on file.    This visit occurred during the SARS-CoV-2 public health emergency.  Safety protocols were in place, including screening questions prior to the visit, additional usage of staff PPE, and extensive cleaning of exam room while observing appropriate contact time as indicated for disinfecting solutions.

## 2021-02-04 NOTE — Addendum Note (Signed)
Addended by: Mammie Lorenzo on: 02/04/2021 10:10 PM   Modules accepted: Level of Service

## 2021-02-04 NOTE — Assessment & Plan Note (Signed)
Ultrasound ordered for further evaluation.  If this is inconclusive we may need to obtain CT scan.

## 2021-02-05 ENCOUNTER — Other Ambulatory Visit: Payer: Self-pay | Admitting: Family Medicine

## 2021-02-05 ENCOUNTER — Ambulatory Visit (INDEPENDENT_AMBULATORY_CARE_PROVIDER_SITE_OTHER): Payer: Managed Care, Other (non HMO)

## 2021-02-05 DIAGNOSIS — R1031 Right lower quadrant pain: Secondary | ICD-10-CM

## 2021-02-16 ENCOUNTER — Ambulatory Visit: Payer: Managed Care, Other (non HMO) | Admitting: Sports Medicine

## 2021-04-02 ENCOUNTER — Other Ambulatory Visit: Payer: Self-pay | Admitting: Sports Medicine

## 2021-04-02 DIAGNOSIS — M5136 Other intervertebral disc degeneration, lumbar region: Secondary | ICD-10-CM

## 2021-09-09 ENCOUNTER — Encounter: Payer: Self-pay | Admitting: Neurology

## 2022-07-05 ENCOUNTER — Other Ambulatory Visit: Payer: Self-pay | Admitting: Neurology

## 2023-01-29 IMAGING — US US PELVIS LIMITED
1 series · 14 of 16 positions shown · non-contrast
Comparison: None.

CLINICAL DATA: Right inguinal swelling, evaluate for hernia

EXAM:
LIMITED ULTRASOUND OF PELVIS
TECHNIQUE: Limited transabdominal ultrasound examination of the pelvis was
performed.

[Series 1: us pelvis limited (transabdominal only) · 16 acquisitions, 14 frames shown]
[im 1/16]
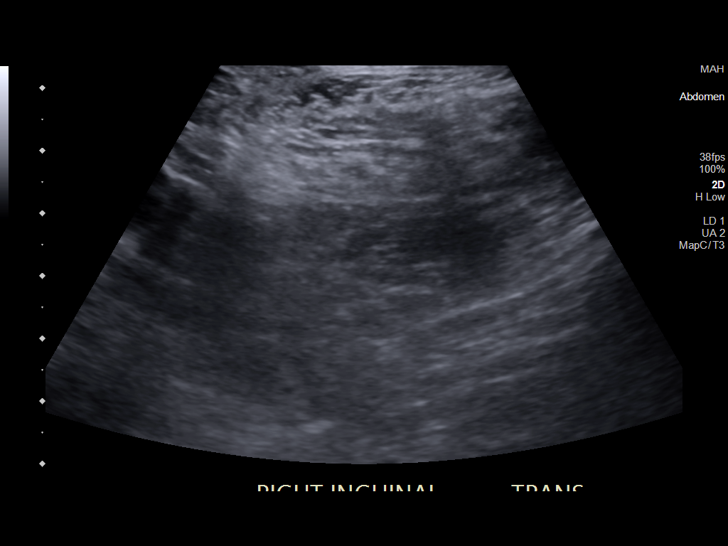
[im 2/16]
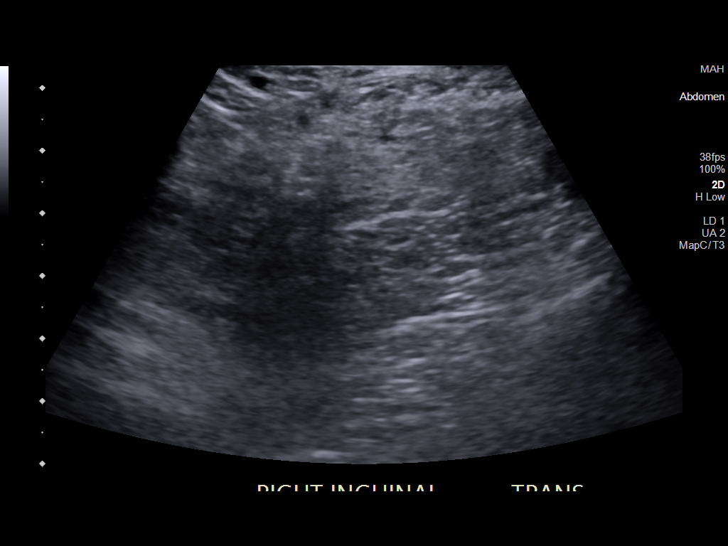
[im 3/16]
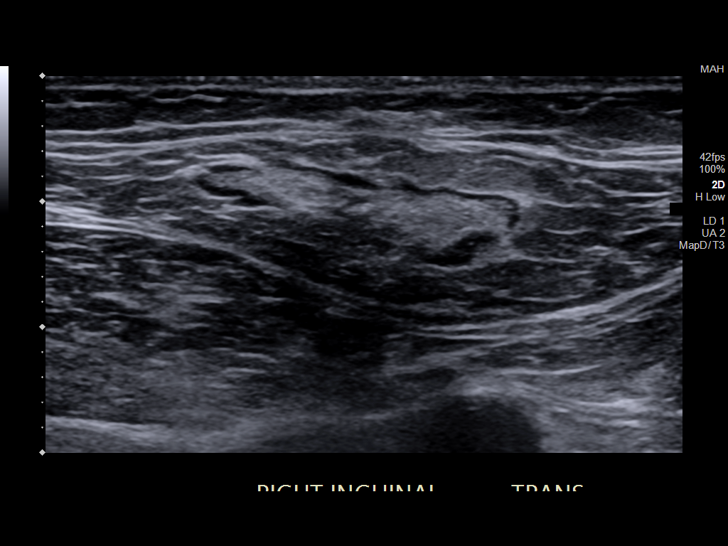
[im 5/16]
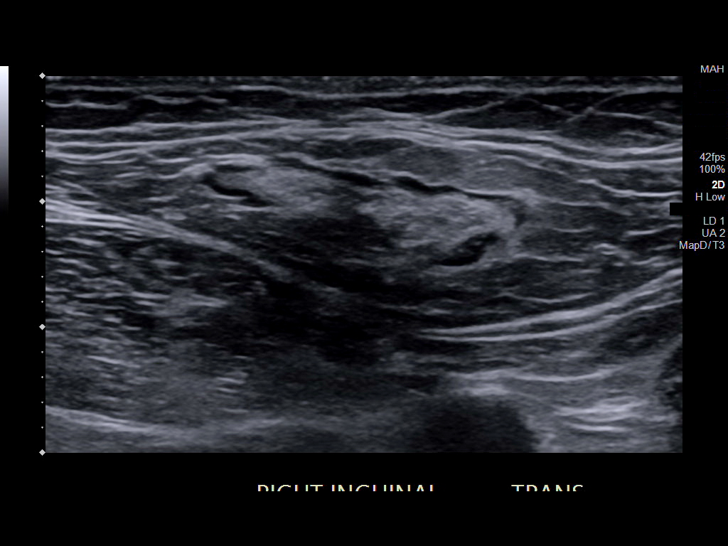
[im 6/16]
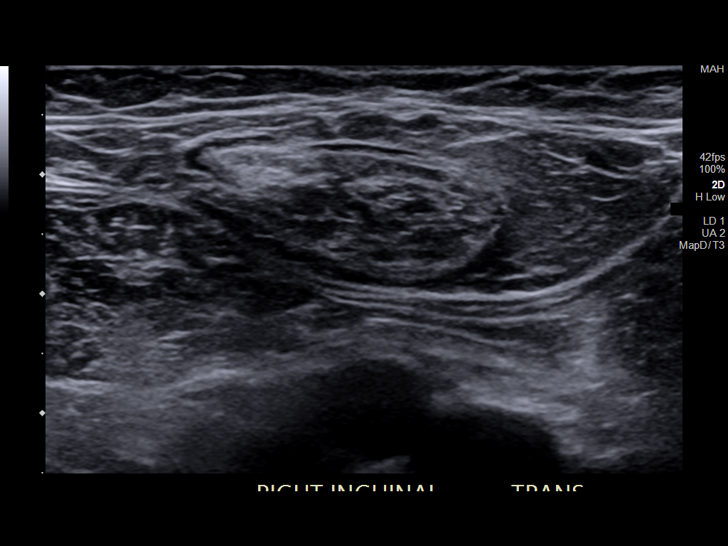
[im 7/16]
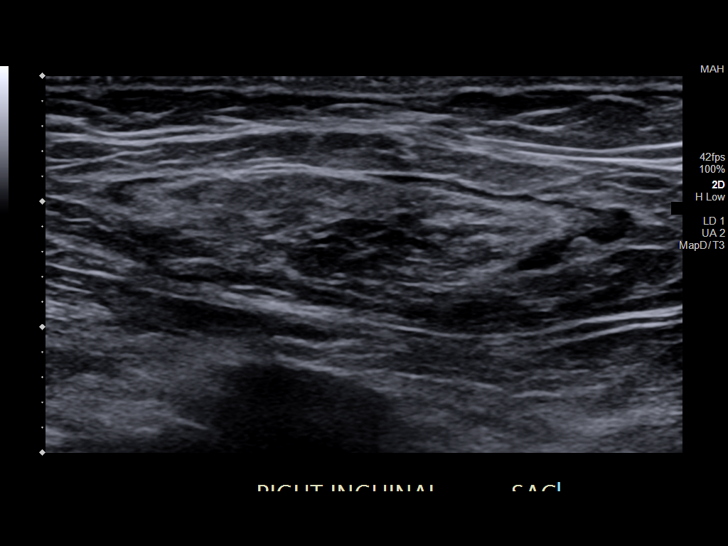
[im 8/16]
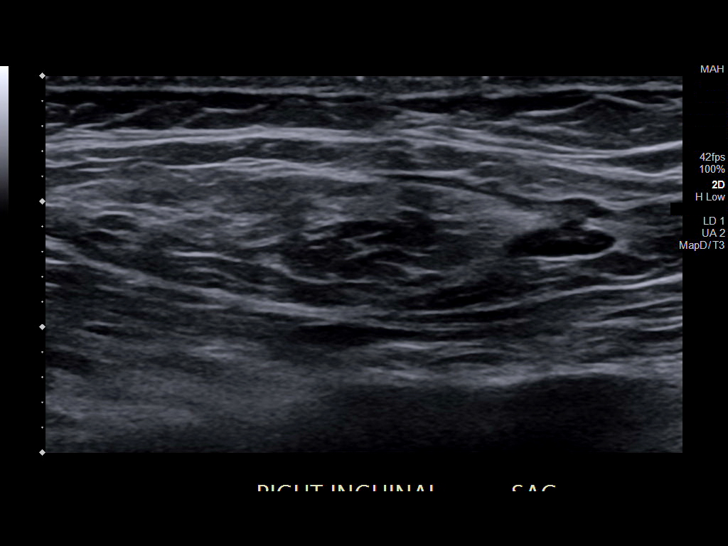
[im 9/16]
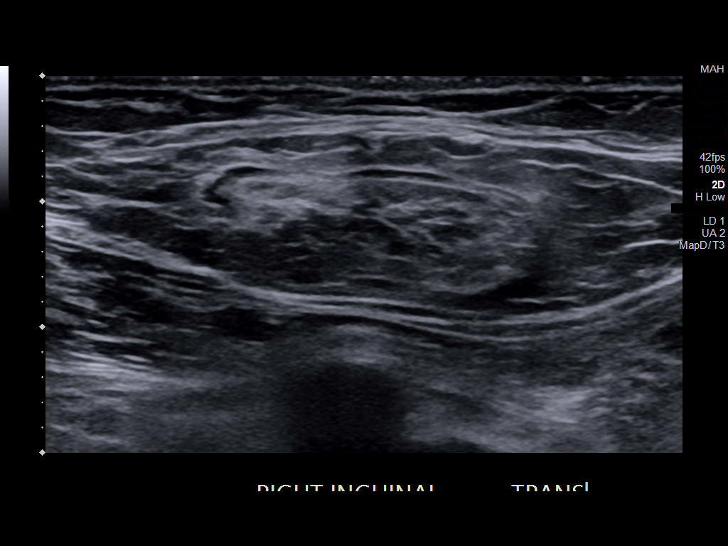
[im 10/16]
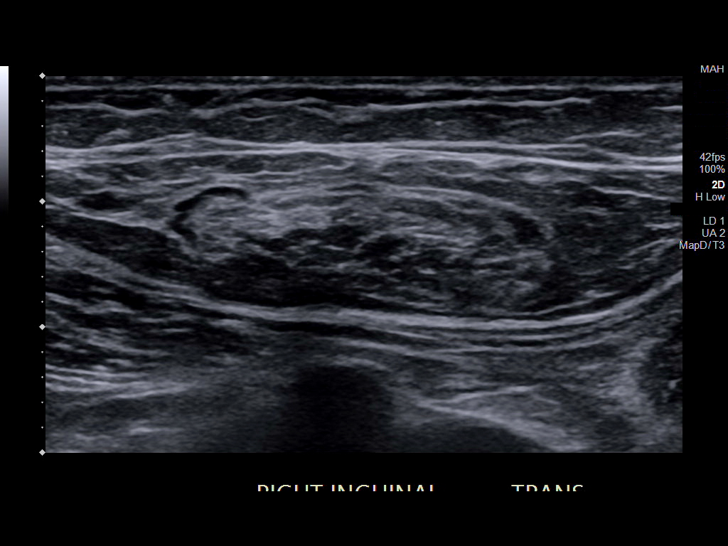
[im 11/16]
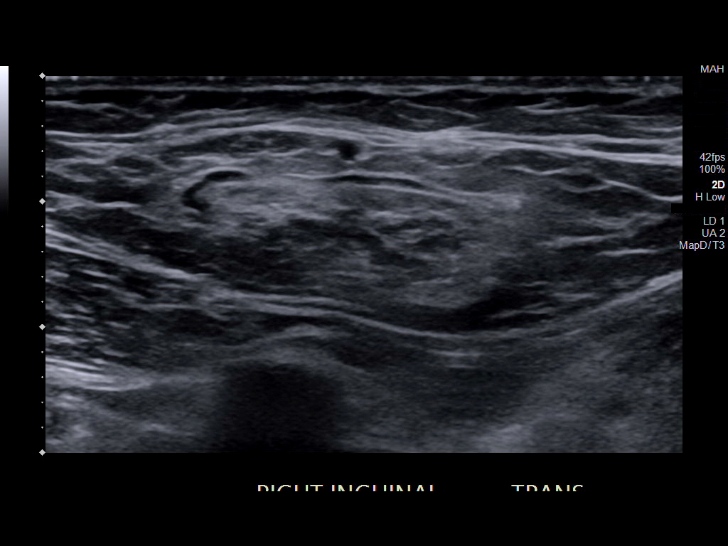
[im 13/16]
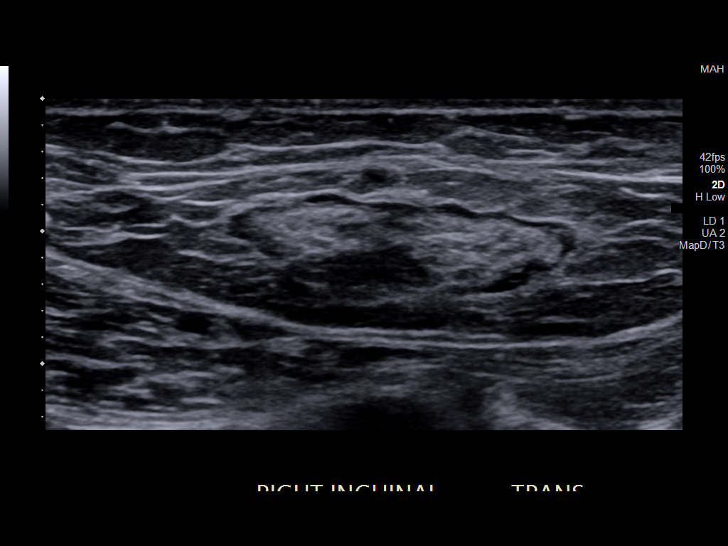
[im 14/16]
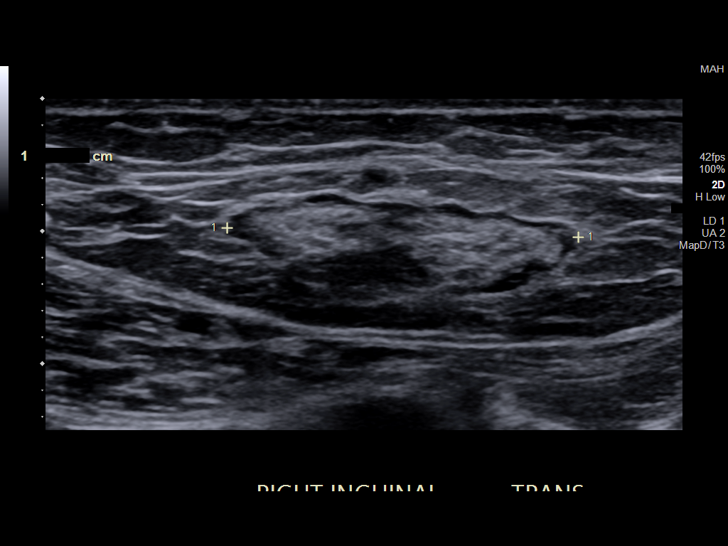
[im 15/16]
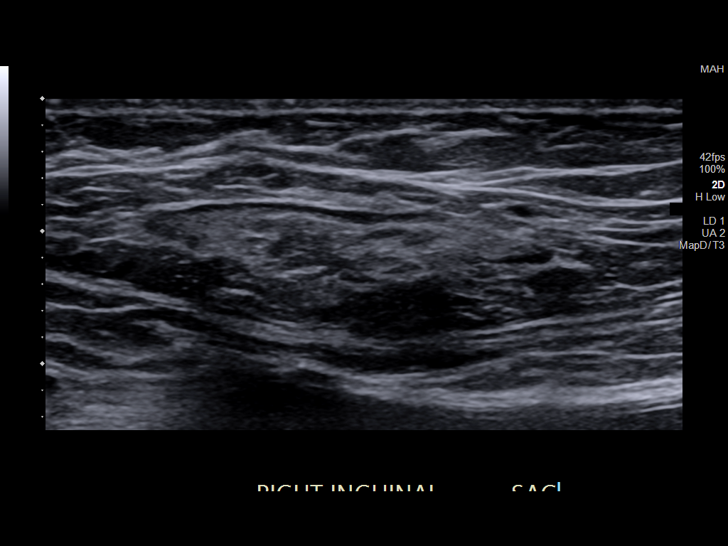
[im 16/16]
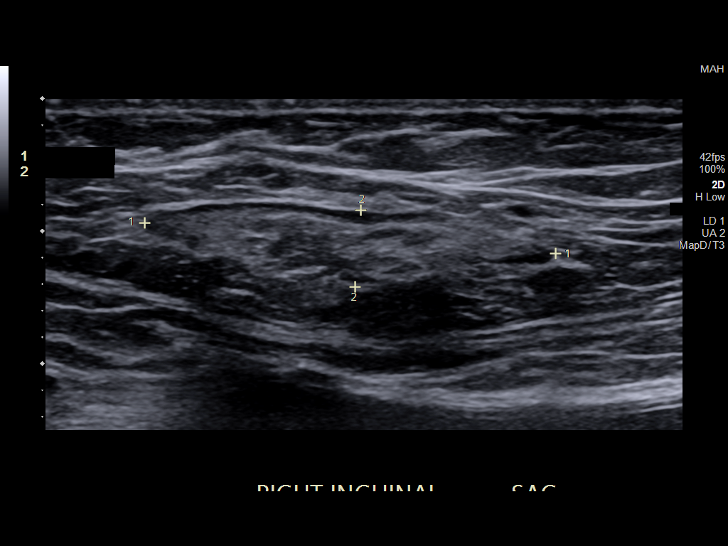

[14 of 16 positions shown; findings below may reference images not displayed]

FINDINGS: In the area of clinical concern, there is a 3.1 cm x 0.6 cm x 2.7 cm
lesion likely reflecting a lymph node. The node demonstrates normal
reniform morphology with a fatty hilum. There is no evidence of
hernia.
IMPRESSION: 1. 0.6 cm short axis lymph node in the right inguinal region in the
area of concern. This is nonspecific and may be reactive.
2. No evidence of hernia.

## 2023-12-20 ENCOUNTER — Encounter: Payer: Self-pay | Admitting: Sports Medicine
